# Patient Record
Sex: Female | Born: 1990 | Race: Black or African American | Hispanic: No | Marital: Single | Smoking: Never smoker
Health system: Southern US, Community
[De-identification: ages and names within clinical notes are randomized; demographics above are authoritative.]

## PROBLEM LIST (undated history)

## (undated) DIAGNOSIS — Z789 Other specified health status: Secondary | ICD-10-CM

## (undated) HISTORY — DX: Other specified health status: Z78.9

## (undated) HISTORY — PX: FOOT SURGERY: SHX648

---
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
## 2014-02-17 ENCOUNTER — Emergency Department (HOSPITAL_COMMUNITY)
Admission: EM | Admit: 2014-02-17 | Discharge: 2014-02-17 | Disposition: A | Discharge status: Home / Self Care | Payer: Medicaid Other | Attending: Emergency Medicine | Admitting: Emergency Medicine

## 2014-02-17 ENCOUNTER — Encounter (HOSPITAL_COMMUNITY): Payer: Self-pay | Admitting: Emergency Medicine

## 2014-02-17 DIAGNOSIS — N760 Acute vaginitis: Secondary | ICD-10-CM

## 2014-02-17 DIAGNOSIS — Z3A25 25 weeks gestation of pregnancy: Secondary | ICD-10-CM | POA: Diagnosis not present

## 2014-02-17 DIAGNOSIS — O23592 Infection of other part of genital tract in pregnancy, second trimester: Secondary | ICD-10-CM | POA: Diagnosis not present

## 2014-02-17 DIAGNOSIS — O2342 Unspecified infection of urinary tract in pregnancy, second trimester: Secondary | ICD-10-CM | POA: Diagnosis present

## 2014-02-17 DIAGNOSIS — N390 Urinary tract infection, site not specified: Secondary | ICD-10-CM

## 2014-02-17 DIAGNOSIS — B9689 Other specified bacterial agents as the cause of diseases classified elsewhere: Secondary | ICD-10-CM

## 2014-02-17 DIAGNOSIS — Z3490 Encounter for supervision of normal pregnancy, unspecified, unspecified trimester: Secondary | ICD-10-CM

## 2014-02-17 DIAGNOSIS — Z79899 Other long term (current) drug therapy: Secondary | ICD-10-CM | POA: Diagnosis not present

## 2014-02-17 LAB — URINALYSIS, ROUTINE W REFLEX MICROSCOPIC
Glucose, UA: NEGATIVE mg/dL
Hgb urine dipstick: NEGATIVE
Ketones, ur: NEGATIVE mg/dL
Nitrite: NEGATIVE
PH: 7 (ref 5.0–8.0)
Protein, ur: 30 mg/dL — AB
Specific Gravity, Urine: 1.028 (ref 1.005–1.030)
Urobilinogen, UA: 1 mg/dL (ref 0.0–1.0)

## 2014-02-17 LAB — RPR

## 2014-02-17 LAB — WET PREP, GENITAL
TRICH WET PREP: NONE SEEN
Yeast Wet Prep HPF POC: NONE SEEN

## 2014-02-17 LAB — URINE MICROSCOPIC-ADD ON

## 2014-02-17 LAB — HIV ANTIBODY (ROUTINE TESTING W REFLEX): HIV 1&2 Ab, 4th Generation: NONREACTIVE

## 2014-02-17 MED ORDER — CEPHALEXIN 500 MG PO CAPS: ORAL_CAPSULE | ORAL | Status: DC

## 2014-02-17 MED ORDER — METRONIDAZOLE 500 MG PO TABS: 500.0000 mg | ORAL_TABLET | Freq: Two times a day (BID) | ORAL | Status: DC

## 2014-02-17 NOTE — ED Provider Notes (Signed)
CSN: [id number]     Arrival date & time 02/17/14  1715 History   First MD Initiated Contact with Patient 02/17/14 1728     Chief Complaint  Patient presents with  . [redacted] weeks pregnant, dysuria      (Consider location/radiation/quality/duration/timing/severity/associated sxs/prior Treatment) HPI 23 year old female gravida 3 para 2 approximately 25 weeks estimated gestational age without complications during this pregnancy blood type A+ presents with one week of dysuria with white vaginal discharge with itching with no abdominal pain no vaginal bleeding no vomiting no rash no fever no lightheadedness no chest pain no shortness breath no other concerns. She recently moved to the area and has not seen a new OB doctor here yet although had been having routine OB care in [place] until August this year without complications. There is no treatment prior to arrival. History reviewed. No pertinent past medical history. Past Surgical History  Procedure Laterality Date  . Foot surgery      left foot 2010   History reviewed. No pertinent family history. History  Substance Use Topics  . Smoking status: Never Smoker   . Smokeless tobacco: Not on file  . Alcohol Use: No   OB History   Grav Para Term Preterm Abortions TAB SAB Ect Mult Living   1              Review of Systems  10 Systems reviewed and are negative for acute change except as noted in the HPI.  Allergies  Review of patient's allergies indicates no known allergies.  Home Medications   Prior to Admission medications   Medication Sig Start Date End Date Taking? Authorizing Provider  cephALEXin (KEFLEX) 500 MG capsule 2 caps po bid x 7 days 02/17/14   Hurman Horn, MD  metroNIDAZOLE (FLAGYL) 500 MG tablet Take 1 tablet (500 mg total) by mouth 2 (two) times daily. One po bid x 7 days 02/17/14   Hurman Horn, MD  Prenatal Vit-Fe Fumarate-FA (PRENATAL MULTIVITAMIN) TABS tablet Take 1 tablet by mouth daily at 12 noon.   Yes  Historical Provider, MD   BP 110/61  Pulse 67  Temp(Src) 99.5 F (37.5 C) (Oral)  Resp 16  SpO2 100% Physical Exam  Nursing note and vitals reviewed. Constitutional:  Awake, alert, nontoxic appearance.  HENT:  Head: Atraumatic.  Eyes: Right eye exhibits no discharge. Left eye exhibits no discharge.  Neck: Neck supple.  Cardiovascular: Normal rate and regular rhythm.   No murmur heard. Pulmonary/Chest: Effort normal and breath sounds normal. No respiratory distress. She has no wheezes. She has no rales. She exhibits no tenderness.  Abdominal: Soft. Bowel sounds are normal. She exhibits mass. She exhibits no distension. There is no tenderness. There is no rebound and no guarding.  Nontender gravid uterus with POCUS reveals fetal heart rate 150  Genitourinary:  Chaperone present for pelvic examination no rash noted cervix is closed white discharge present no cervical motion tenderness no adnexal tenderness uterus is nontender and gravid  Musculoskeletal: She exhibits no tenderness.  Baseline ROM, no obvious new focal weakness.  Neurological: She is alert.  Mental status and motor strength appears baseline for patient and situation.  Skin: No rash noted.  Psychiatric: She has a normal mood and affect.    ED Course  Procedures (including critical care time) Labs Review Labs Reviewed  WET PREP, GENITAL - Abnormal; Notable for the following:    Clue Cells Wet Prep HPF POC FEW (*)    WBC, Wet  Prep HPF POC FEW (*)    All other components within normal limits  URINALYSIS, ROUTINE W REFLEX MICROSCOPIC - Abnormal; Notable for the following:    Color, Urine AMBER (*)    APPearance CLOUDY (*)    Bilirubin Urine SMALL (*)    Protein, ur 30 (*)    Leukocytes, UA SMALL (*)    All other components within normal limits  URINE MICROSCOPIC-ADD ON - Abnormal; Notable for the following:    Squamous Epithelial / LPF MANY (*)    All other components within normal limits  GC/CHLAMYDIA PROBE  AMP  URINE CULTURE  RPR  HIV ANTIBODY (ROUTINE TESTING)    Imaging Review No results found.   EKG Interpretation None      MDM   Final diagnoses:  UTI (lower urinary tract infection)  Bacterial vaginosis  Pregnancy    Patient informed of clinical course, understand medical decision-making process, and agree with plan. Questionable UTI, questionable BV.  I doubt any other EMC precluding discharge at this time including, but not necessarily limited to the following:premature labor.    Hurman HornJohn M Raaga Maeder, MD 02/18/14 [phone]

## 2014-02-17 NOTE — ED Notes (Signed)
Pt reports she is [redacted] weeks pregnant, 3rd pregnancy, 2 children, no complications with any pregnancy. [place] here [place], has not seen a doctor here. Last time she saw a doctor was in August. Reports last time sexual intercourse was in August. Pt reports dysuria, vaginal discharge, and itching x1 week. Pain 1/10. Reports feeling baby move in last hour.

## 2014-02-18 LAB — GC/CHLAMYDIA PROBE AMP
CT Probe RNA: NEGATIVE
GC Probe RNA: NEGATIVE

## 2014-02-19 LAB — URINE CULTURE

## 2014-02-25 ENCOUNTER — Encounter: Payer: Self-pay | Admitting: Advanced Practice Midwife

## 2014-02-25 ENCOUNTER — Other Ambulatory Visit: Payer: Self-pay | Admitting: General Practice

## 2014-02-25 DIAGNOSIS — B379 Candidiasis, unspecified: Secondary | ICD-10-CM

## 2014-02-25 DIAGNOSIS — T3695XA Adverse effect of unspecified systemic antibiotic, initial encounter: Principal | ICD-10-CM

## 2014-02-25 MED ORDER — FLUCONAZOLE 150 MG PO TABS: 150.0000 mg | ORAL_TABLET | Freq: Once | ORAL | Status: DC

## 2014-03-06 ENCOUNTER — Encounter: Payer: Self-pay | Admitting: Advanced Practice Midwife

## 2014-03-06 ENCOUNTER — Ambulatory Visit (INDEPENDENT_AMBULATORY_CARE_PROVIDER_SITE_OTHER): Payer: Medicaid Other | Admitting: Advanced Practice Midwife

## 2014-03-06 ENCOUNTER — Encounter: Payer: Self-pay | Admitting: *Deleted

## 2014-03-06 VITALS — BP 118/66 | HR 88 | Temp 98.7°F | Ht 65.0 in | Wt 143.6 lb

## 2014-03-06 DIAGNOSIS — Z3483 Encounter for supervision of other normal pregnancy, third trimester: Secondary | ICD-10-CM

## 2014-03-06 DIAGNOSIS — Z3480 Encounter for supervision of other normal pregnancy, unspecified trimester: Secondary | ICD-10-CM | POA: Insufficient documentation

## 2014-03-06 DIAGNOSIS — Z23 Encounter for immunization: Secondary | ICD-10-CM

## 2014-03-06 LAB — POCT URINALYSIS DIP (DEVICE)
Bilirubin Urine: NEGATIVE
Glucose, UA: NEGATIVE mg/dL
HGB URINE DIPSTICK: NEGATIVE
KETONES UR: NEGATIVE mg/dL
Nitrite: NEGATIVE
PROTEIN: NEGATIVE mg/dL
SPECIFIC GRAVITY, URINE: 1.02 (ref 1.005–1.030)
Urobilinogen, UA: 1 mg/dL (ref 0.0–1.0)
pH: 7 (ref 5.0–8.0)

## 2014-03-06 LAB — CBC
HEMATOCRIT: 25.3 % — AB (ref 36.0–46.0)
HEMOGLOBIN: 8.6 g/dL — AB (ref 12.0–15.0)
MCH: 31.7 pg (ref 26.0–34.0)
MCHC: 34 g/dL (ref 30.0–36.0)
MCV: 93.4 fL (ref 78.0–100.0)
Platelets: 214 10*3/uL (ref 150–400)
RBC: 2.71 MIL/uL — ABNORMAL LOW (ref 3.87–5.11)
RDW: 12.7 % (ref 11.5–15.5)
WBC: 5.2 10*3/uL (ref 4.0–10.5)

## 2014-03-06 MED ORDER — TETANUS-DIPHTH-ACELL PERTUSSIS 5-2.5-18.5 LF-MCG/0.5 IM SUSP: 0.5000 mL | Freq: Once | INTRAMUSCULAR | Status: DC

## 2014-03-06 NOTE — Progress Notes (Signed)
Contacted [facility]-- after 15 minutes of being on hold was forwarded to voicemail system. Left message stating we had faxed over [facility] for records at the beginning of October and have uyet to receive these records. Asked that they please fax records and call to inform us they are on there way.

## 2014-03-06 NOTE — Progress Notes (Signed)
Meredith Simpson contacted [facility] for prenatal records and received faxed stating that there office mailed out her records on 02/28/14.

## 2014-03-06 NOTE — Progress Notes (Signed)
Pain in the groin area New ob packet and 28 week packet given Weight gain of 25-35lbs Tdap/flu consented and info given

## 2014-03-06 NOTE — Progress Notes (Signed)
See new ob note  Subjective:    Meredith Simpson Simpson is a [id number] being seen today for her first obstetrical visit.  Her obstetrical history is significant for NSVD x2. Patient does intend to breast feed. Pregnancy history fully reviewed.  Doing well.  Good fetal movement, denies vaginal bleeding, LOF, regular contractions. Pain in left lower groin area with movement.  Discussed round ligament pain, warm bath, heat, ice, Tylenol, good ergonomics for pain.   Patient reports round ligament pain.  Filed Vitals:   03/06/14 0934 03/06/14 0937  BP: 118/66   Pulse: 88   Temp: 98.7 F (37.1 C)   Height:  5\' 5"  (1.651 m)  Weight: 65.137 kg (143 lb 9.6 oz)     HISTORY: OB History  Gravida Para Term Preterm AB SAB TAB Ectopic Multiple Living  3 2 2       2     # Outcome Date GA Lbr Len/2nd Weight Sex Delivery Anes PTL Lv  3 CUR           2 TRM 03/19/11 3474w0d 00:20 3.175 kg (7 lb) F SVD None  Y  1 TRM 11/24/09 4474w0d 01:00 2.722 kg (6 lb) F SVD None  Y     Past Medical History  Diagnosis Date  . Medical history non-contributory    Past Surgical History  Procedure Laterality Date  . Foot surgery      left foot 2010  . Foot surgery  2010   No family history on file.   Exam    Uterus:  Fundal Height: 29 cm  Pelvic Exam: Deferred-done at previous provider--records requested   Extremities: ROM of all joints is normal   HEENT neck supple with midline trachea and thyroid without masses   Mouth/Teeth mucous membranes moist, pharynx normal without lesions and dental hygiene good   Neck supple and no masses   Cardiovascular:    Respiratory:  appears well, vitals normal, no respiratory distress, acyanotic, normal RR, ear and throat exam is normal, neck free of mass or lymphadenopathy   Abdomen: soft, non-tender; bowel sounds normal; no masses,  no organomegaly   Urinary:       Assessment:    Pregnancy: G3P2002 There are no active problems to display for this patient.        Plan:     Initial labs drawn. Prenatal vitamins. Problem list reviewed and updated. Genetic Screening discussed Quad Screen: May have had these done in [place], records requested.  Marland Kitchen.  Ultrasound discussed; fetal survey: records requested, anatomy scan normal per pt.  Follow up in 2 weeks. 50% of 30 min visit spent on counseling and coordination of care.     Simpson, Meredith 03/06/2014

## 2014-03-07 LAB — GLUCOSE TOLERANCE, 1 HOUR (50G) W/O FASTING: Glucose, 1 Hour GTT: 78 mg/dL (ref 70–140)

## 2014-03-07 LAB — HIV ANTIBODY (ROUTINE TESTING W REFLEX): HIV 1&2 Ab, 4th Generation: NONREACTIVE

## 2014-03-07 LAB — RPR

## 2014-03-10 ENCOUNTER — Encounter: Payer: Self-pay | Admitting: Advanced Practice Midwife

## 2014-03-19 ENCOUNTER — Encounter: Payer: Self-pay | Admitting: Advanced Practice Midwife

## 2014-03-20 ENCOUNTER — Encounter: Payer: Self-pay | Admitting: Advanced Practice Midwife

## 2014-03-20 ENCOUNTER — Ambulatory Visit (INDEPENDENT_AMBULATORY_CARE_PROVIDER_SITE_OTHER): Payer: Medicaid Other | Admitting: Advanced Practice Midwife

## 2014-03-20 VITALS — BP 118/70 | HR 86 | Temp 98.5°F | Wt 151.5 lb

## 2014-03-20 DIAGNOSIS — O99019 Anemia complicating pregnancy, unspecified trimester: Secondary | ICD-10-CM

## 2014-03-20 DIAGNOSIS — R87612 Low grade squamous intraepithelial lesion on cytologic smear of cervix (LGSIL): Secondary | ICD-10-CM | POA: Insufficient documentation

## 2014-03-20 DIAGNOSIS — O99013 Anemia complicating pregnancy, third trimester: Secondary | ICD-10-CM | POA: Insufficient documentation

## 2014-03-20 DIAGNOSIS — Z3483 Encounter for supervision of other normal pregnancy, third trimester: Secondary | ICD-10-CM

## 2014-03-20 LAB — IRON AND TIBC
%SAT: 7 % — ABNORMAL LOW (ref 20–55)
Iron: 33 ug/dL — ABNORMAL LOW (ref 42–145)
TIBC: 462 ug/dL (ref 250–470)
UIBC: 429 ug/dL — ABNORMAL HIGH (ref 125–400)

## 2014-03-20 LAB — FOLATE: FOLATE: 18.2 ng/mL

## 2014-03-20 LAB — VITAMIN B12: Vitamin B-12: 401 pg/mL (ref 211–911)

## 2014-03-20 LAB — FERRITIN: FERRITIN: 9 ng/mL — AB (ref 10–291)

## 2014-03-20 MED ORDER — FERROUS SULFATE 134 MG PO TABS
1.0000 | ORAL_TABLET | Freq: Two times a day (BID) | ORAL | Status: AC
Start: 2014-03-20 — End: ?

## 2014-03-20 MED ORDER — DOCUSATE SODIUM 100 MG PO CAPS: 100.0000 mg | ORAL_CAPSULE | Freq: Two times a day (BID) | ORAL | Status: AC

## 2014-03-20 NOTE — Progress Notes (Signed)
[facility]. EDD by 19 week US 05/30/13. Anatomy scan incomplete, but normal female. LSIL Pap. No HPV testing done. A Pos.

## 2014-03-20 NOTE — Progress Notes (Signed)
Patient reports occasional sharp pelvic pains

## 2014-03-20 NOTE — Patient Instructions (Addendum)
Preterm Labor Information Preterm labor is when labor starts at less than 37 weeks of pregnancy. The normal length of a pregnancy is 39 to 41 weeks. CAUSES Often, there is no identifiable underlying cause as to why a woman goes into preterm labor. One of the most common known causes of preterm labor is infection. Infections of the uterus, cervix, vagina, amniotic sac, bladder, kidney, or even the lungs (pneumonia) can cause labor to start. Other suspected causes of preterm labor include:   Urogenital infections, such as yeast infections and bacterial vaginosis.   Uterine abnormalities (uterine shape, uterine septum, fibroids, or bleeding from the placenta).   A cervix that has been operated on (it may fail to stay closed).   Malformations in the fetus.   Multiple gestations (twins, triplets, and so on).   Breakage of the amniotic sac.  RISK FACTORS  Having a previous history of preterm labor.   Having premature rupture of membranes (PROM).   Having a placenta that covers the opening of the cervix (placenta previa).   Having a placenta that separates from the uterus (placental abruption).   Having a cervix that is too weak to hold the fetus in the uterus (incompetent cervix).   Having too much fluid in the amniotic sac (polyhydramnios).   Taking illegal drugs or smoking while pregnant.   Not gaining enough weight while pregnant.   Being younger than 7918 and older than 24 years old.   Having a low socioeconomic status.   Being African American. SYMPTOMS Signs and symptoms of preterm labor include:   Menstrual-like cramps, abdominal pain, or back pain.  Uterine contractions that are regular, as frequent as six in an hour, regardless of their intensity (may be mild or painful).  Contractions that start on the top of the uterus and spread down to the lower abdomen and back.   A sense of increased pelvic pressure.   A watery or bloody mucus discharge that  comes from the vagina.  TREATMENT Depending on the length of the pregnancy and other circumstances, your health care provider may suggest bed rest. If necessary, there are medicines that can be given to stop contractions and to mature the fetal lungs. If labor happens before 34 weeks of pregnancy, a prolonged hospital stay may be recommended. Treatment depends on the condition of both you and the fetus.  WHAT SHOULD YOU DO IF YOU THINK YOU ARE IN PRETERM LABOR? Call your health care provider right away. You will need to go to the hospital to get checked immediately. HOW CAN YOU PREVENT PRETERM LABOR IN FUTURE PREGNANCIES? You should:   Stop smoking if you smoke.  Maintain healthy weight gain and avoid chemicals and drugs that are not necessary.  Be watchful for any type of infection.  Inform your health care provider if you have a known history of preterm labor. Document Released: 07/24/2003 Document Revised: 01/03/2013 Document Reviewed: 06/05/2012      Iron-Rich Diet An iron-rich diet contains foods that are good sources of iron. Iron is an important mineral that helps your body produce hemoglobin. Hemoglobin is a protein in red blood cells that carries oxygen to the body's tissues. Sometimes, the iron level in your blood can be low. This may be caused by:  A lack of iron in your diet.  Blood loss.  Times of growth, such as during pregnancy or during a child's growth and development. Low levels of iron can cause a decrease in the number of red blood cells.  This can result in iron deficiency anemia. Iron deficiency anemia symptoms include:  Tiredness.  Weakness.  Irritability.  Increased chance of infection. Here are some recommendations for daily iron intake:  Males older than 24 years of age need 8 mg of iron per day.  Women ages 1919 to 8050 need 18 mg of iron per day.  Pregnant women need 27 mg of iron per day, and women who are over 24 years of age and breastfeeding  need 9 mg of iron per day.  Women over the age of 250 need 8 mg of iron per day. SOURCES OF IRON There are 2 types of iron that are found in food: heme iron and nonheme iron. Heme iron is absorbed by the body better than nonheme iron. Heme iron is found in meat, poultry, and fish. Nonheme iron is found in grains, beans, and vegetables. Heme Iron Sources Food / Iron (mg)  Chicken liver, 3 oz (85 g)/ 10 mg  Beef liver, 3 oz (85 g)/ 5.5 mg  Oysters, 3 oz (85 g)/ 8 mg  Beef, 3 oz (85 g)/ 2 to 3 mg  Shrimp, 3 oz (85 g)/ 2.8 mg  [place], 3 oz (85 g)/ 2 mg  Chicken, 3 oz (85 g) / 1 mg  Fish (tuna, halibut), 3 oz (85 g)/ 1 mg  Pork, 3 oz (85 g)/ 0.9 mg Nonheme Iron Sources Food / Iron (mg)  Ready-to-eat breakfast cereal, iron-fortified / 3.9 to 7 mg  Tofu,  cup / 3.4 mg  Kidney beans,  cup / 2.6 mg  Baked potato with skin / 2.7 mg  Asparagus,  cup / 2.2 mg  Avocado / 2 mg  Dried peaches,  cup / 1.6 mg  Raisins,  cup / 1.5 mg  Soy milk, 1 cup / 1.5 mg  Whole-wheat bread, 1 slice / 1.2 mg  Spinach, 1 cup / 0.8 mg  Broccoli,  cup / 0.6 mg IRON ABSORPTION Certain foods can decrease the body's absorption of iron. Try to avoid these foods and beverages while eating meals with iron-containing foods:  Coffee.  Tea.  Fiber.  Soy. Foods containing vitamin C can help increase the amount of iron your body absorbs from iron sources, especially from nonheme sources. Eat foods with vitamin C along with iron-containing foods to increase your iron absorption. Foods that are high in vitamin C include many fruits and vegetables. Some good sources are:  Fresh orange juice.  Oranges.  Strawberries.  Mangoes.  Grapefruit.  Red bell peppers.  Green bell peppers.  Broccoli.  Potatoes with skin.  Tomato juice. Document Released: 12/15/2004 Document Revised: 07/26/2011 Document Reviewed: 10/22/2010 [facility] Patient Information 2015 [employer] [place]. This  information is not intended to replace advice given to you by your health care provider. Make sure you discuss any questions you have with your health care provider.

## 2014-03-20 NOTE — Progress Notes (Signed)
Pt. With no complaints this visit. She reports intermittent lateral wall abdominal pains that she says are better with warm baths. She denies VB, LOF, Discharge, Fever, Chills, Nausea, or Vomiting. She reports good FM, and says that she feels that her pregnancy is going well. She reports longstanding history of anemia that she says has never been worked up before. She says that her children are anemic, but does not know if her parents are anemic or not. She does not know if she has a family history of SS disease or Thalassemia. She denies SOB, weakness, fatigue, or exercise intolerance at this time.

## 2014-03-21 LAB — PRENATAL PROFILE (SOLSTAS)
ANTIBODY SCREEN: NEGATIVE
Basophils Absolute: 0 10*3/uL (ref 0.0–0.1)
Basophils Relative: 0 % (ref 0–1)
Eosinophils Absolute: 0.1 10*3/uL (ref 0.0–0.7)
Eosinophils Relative: 1 % (ref 0–5)
HEMATOCRIT: 23.5 % — AB (ref 36.0–46.0)
HIV 1&2 Ab, 4th Generation: NONREACTIVE
Hemoglobin: 8.2 g/dL — ABNORMAL LOW (ref 12.0–15.0)
Hepatitis B Surface Ag: NEGATIVE
LYMPHS ABS: 1.6 10*3/uL (ref 0.7–4.0)
Lymphocytes Relative: 30 % (ref 12–46)
MCH: 31.7 pg (ref 26.0–34.0)
MCHC: 34.9 g/dL (ref 30.0–36.0)
MCV: 90.7 fL (ref 78.0–100.0)
MONOS PCT: 10 % (ref 3–12)
Monocytes Absolute: 0.5 10*3/uL (ref 0.1–1.0)
Neutro Abs: 3.1 10*3/uL (ref 1.7–7.7)
Neutrophils Relative %: 59 % (ref 43–77)
Platelets: 211 10*3/uL (ref 150–400)
RBC: 2.59 MIL/uL — ABNORMAL LOW (ref 3.87–5.11)
RDW: 13 % (ref 11.5–15.5)
RH TYPE: POSITIVE
Rubella: 3.3 Index — ABNORMAL HIGH (ref <0.90)
WBC: 5.3 10*3/uL (ref 4.0–10.5)

## 2014-03-22 ENCOUNTER — Ambulatory Visit (HOSPITAL_COMMUNITY)
Admission: RE | Admit: 2014-03-22 | Discharge: 2014-03-22 | Disposition: A | Discharge status: Home / Self Care | Payer: Medicaid Other | Source: Ambulatory Visit | Attending: Advanced Practice Midwife | Admitting: Advanced Practice Midwife

## 2014-03-22 DIAGNOSIS — Z3483 Encounter for supervision of other normal pregnancy, third trimester: Secondary | ICD-10-CM

## 2014-03-22 DIAGNOSIS — Z36 Encounter for antenatal screening of mother: Secondary | ICD-10-CM | POA: Diagnosis present

## 2014-03-22 DIAGNOSIS — Z3A30 30 weeks gestation of pregnancy: Secondary | ICD-10-CM | POA: Insufficient documentation

## 2014-03-22 DIAGNOSIS — Z1389 Encounter for screening for other disorder: Secondary | ICD-10-CM | POA: Insufficient documentation

## 2014-03-22 LAB — HEMOGLOBINOPATHY EVALUATION
HEMOGLOBIN OTHER: 0 %
Hgb A2 Quant: 2.4 % (ref 2.2–3.2)
Hgb A: 97.6 % (ref 96.8–97.8)
Hgb F Quant: 0 % (ref 0.0–2.0)
Hgb S Quant: 0 %

## 2014-04-03 ENCOUNTER — Ambulatory Visit (INDEPENDENT_AMBULATORY_CARE_PROVIDER_SITE_OTHER): Payer: Medicaid Other | Admitting: Family

## 2014-04-03 VITALS — BP 121/65 | HR 84 | Temp 98.5°F | Wt 152.7 lb

## 2014-04-03 DIAGNOSIS — Z3483 Encounter for supervision of other normal pregnancy, third trimester: Secondary | ICD-10-CM

## 2014-04-03 LAB — POCT URINALYSIS DIP (DEVICE)
BILIRUBIN URINE: NEGATIVE
Glucose, UA: NEGATIVE mg/dL
Hgb urine dipstick: NEGATIVE
Ketones, ur: NEGATIVE mg/dL
Nitrite: NEGATIVE
PH: 7 (ref 5.0–8.0)
PROTEIN: NEGATIVE mg/dL
SPECIFIC GRAVITY, URINE: 1.015 (ref 1.005–1.030)
Urobilinogen, UA: 1 mg/dL (ref 0.0–1.0)

## 2014-04-03 NOTE — Progress Notes (Signed)
Report white, curdy discharge x 2 days.  No vaginal itching or odor.  Wet prep sent.

## 2014-04-03 NOTE — Progress Notes (Signed)
Pt complains of white, curdy discharge

## 2014-04-04 LAB — WET PREP, GENITAL
Clue Cells Wet Prep HPF POC: NONE SEEN
Trich, Wet Prep: NONE SEEN
WBC, Wet Prep HPF POC: NONE SEEN
Yeast Wet Prep HPF POC: NONE SEEN

## 2014-04-17 ENCOUNTER — Ambulatory Visit (INDEPENDENT_AMBULATORY_CARE_PROVIDER_SITE_OTHER): Payer: Medicaid Other | Admitting: Advanced Practice Midwife

## 2014-04-17 ENCOUNTER — Encounter: Payer: Self-pay | Admitting: *Deleted

## 2014-04-17 ENCOUNTER — Encounter: Payer: Self-pay | Admitting: Advanced Practice Midwife

## 2014-04-17 VITALS — BP 115/70 | HR 80 | Temp 98.3°F | Wt 159.6 lb

## 2014-04-17 DIAGNOSIS — O0930 Supervision of pregnancy with insufficient antenatal care, unspecified trimester: Secondary | ICD-10-CM

## 2014-04-17 DIAGNOSIS — Z3483 Encounter for supervision of other normal pregnancy, third trimester: Secondary | ICD-10-CM

## 2014-04-17 LAB — POCT URINALYSIS DIP (DEVICE)
Bilirubin Urine: NEGATIVE
GLUCOSE, UA: NEGATIVE mg/dL
Hgb urine dipstick: NEGATIVE
KETONES UR: NEGATIVE mg/dL
Nitrite: NEGATIVE
Protein, ur: NEGATIVE mg/dL
SPECIFIC GRAVITY, URINE: 1.02 (ref 1.005–1.030)
UROBILINOGEN UA: 2 mg/dL — AB (ref 0.0–1.0)
pH: 7 (ref 5.0–8.0)

## 2014-04-17 NOTE — Progress Notes (Signed)
Moving to [place]. Discussed transfer process. Will needs records sent. Pt reports [facility] US [facility] w/ EDD 05/29/13. Request records.

## 2014-04-17 NOTE — Patient Instructions (Signed)
[facility]   Braxton Hicks Contractions Contractions of the uterus can occur throughout pregnancy. Contractions are not always a sign that you are in labor.  WHAT ARE BRAXTON HICKS CONTRACTIONS?  Contractions that occur before labor are called Braxton Hicks contractions, or false labor. Toward the end of pregnancy (32-34 weeks), these contractions can develop more often and may become more forceful. This is not true labor because these contractions do not result in opening (dilatation) and thinning of the cervix. They are sometimes difficult to tell apart from true labor because these contractions can be forceful and people have different pain tolerances. You should not feel embarrassed if you go to the hospital with false labor. Sometimes, the only way to tell if you are in true labor is for your health care provider to look for changes in the cervix. If there are no prenatal problems or other health problems associated with the pregnancy, it is completely safe to be sent home with false labor and await the onset of true labor. HOW CAN YOU TELL THE DIFFERENCE BETWEEN TRUE AND FALSE LABOR? False Labor  The contractions of false labor are usually shorter and not as hard as those of true labor.   The contractions are usually irregular.   The contractions are often felt in the front of the lower abdomen and in the groin.   The contractions may go away when you walk around or change positions while lying down.   The contractions get weaker and are shorter lasting as time goes on.   The contractions do not usually become progressively stronger, regular, and closer together as with true labor.  True Labor 1. Contractions in true labor last 30-70 seconds, become very regular, usually become more intense, and increase in frequency.  2. The contractions do not go away with walking.  3. The discomfort is usually felt in the top of the  uterus and spreads to the lower abdomen and low back.  4. True labor can be determined by your health care provider with an exam. This will show that the cervix is dilating and getting thinner.  WHAT TO REMEMBER  Keep up with your usual exercises and follow other instructions given by your health care provider.   Take medicines as directed by your health care provider.   Keep your regular prenatal appointments.   Eat and drink lightly if you think you are going into labor.   If Braxton Hicks contractions are making you uncomfortable:   Change your position from lying down or resting to walking, or from walking to resting.   Sit and rest in a tub of warm water.   Drink 2-3 glasses of water. Dehydration may cause these contractions.   Do slow and deep breathing several times an hour.  WHEN SHOULD I SEEK IMMEDIATE MEDICAL CARE? Seek immediate medical care if:  Your contractions become stronger, more regular, and closer together.   You have fluid leaking or gushing from your vagina.   You have a fever.   You pass blood-tinged mucus.   You have vaginal bleeding.   You have continuous abdominal pain.   You have low back pain that you never had before.   You feel your baby's head pushing down and causing pelvic pressure.   Your baby is not moving as much as it used to.  Document Released: 05/03/2005 Document Revised: 05/08/2013 Document Reviewed: 02/12/2013 [facility] Patient Information 2015 [employer], [place]LL[place]. This information is not intended  to replace advice given to you by your health care provider. Make sure you discuss any questions you have with your health care provider.  Fetal Movement Counts Patient Name: __________________________________________________ Patient Due Date: ____________________ Performing a fetal movement count is highly recommended in high-risk pregnancies, but it is good for every pregnant woman to do. Your health care provider may  ask you to start counting fetal movements at 28 weeks of the pregnancy. Fetal movements often increase:  After eating a full meal.  After physical activity.  After eating or drinking something sweet or cold.  At rest. Pay attention to when you feel the baby is most active. This will help you notice a pattern of your baby's sleep and wake cycles and what factors contribute to an increase in fetal movement. It is important to perform a fetal movement count at the same time each day when your baby is normally most active.  HOW TO COUNT FETAL MOVEMENTS 5. Find a quiet and comfortable area to sit or lie down on your left side. Lying on your left side provides the best blood and oxygen circulation to your baby. 6. Write down the day and time on a sheet of paper or in a journal. 7. Start counting kicks, flutters, swishes, rolls, or jabs in a 2-hour period. You should feel at least 10 movements within 2 hours. 8. If you do not feel 10 movements in 2 hours, wait 2-3 hours and count again. Look for a change in the pattern or not enough counts in 2 hours. SEEK MEDICAL CARE IF:  You feel less than 10 counts in 2 hours, tried twice.  There is no movement in over an hour.  The pattern is changing or taking longer each day to reach 10 counts in 2 hours.  You feel the baby is not moving as he or she usually does. Date: ____________ Movements: ____________ Start time: ____________ Doreatha Martin time: ____________  Date: ____________ Movements: ____________ Start time: ____________ Doreatha Martin time: ____________ Date: ____________ Movements: ____________ Start time: ____________ Doreatha Martin time: ____________ Date: ____________ Movements: ____________ Start time: ____________ Doreatha Martin time: ____________ Date: ____________ Movements: ____________ Start time: ____________ Doreatha Martin time: ____________ Date: ____________ Movements: ____________ Start time: ____________ Doreatha Martin time: ____________ Date: ____________ Movements:  ____________ Start time: ____________ Doreatha Martin time: ____________ Date: ____________ Movements: ____________ Start time: ____________ Doreatha Martin time: ____________  Date: ____________ Movements: ____________ Start time: ____________ Doreatha Martin time: ____________ Date: ____________ Movements: ____________ Start time: ____________ Doreatha Martin time: ____________ Date: ____________ Movements: ____________ Start time: ____________ Doreatha Martin time: ____________ Date: ____________ Movements: ____________ Start time: ____________ Doreatha Martin time: ____________ Date: ____________ Movements: ____________ Start time: ____________ Doreatha Martin time: ____________ Date: ____________ Movements: ____________ Start time: ____________ Doreatha Martin time: ____________ Date: ____________ Movements: ____________ Start time: ____________ Doreatha Martin time: ____________  Date: ____________ Movements: ____________ Start time: ____________ Doreatha Martin time: ____________ Date: ____________ Movements: ____________ Start time: ____________ Doreatha Martin time: ____________ Date: ____________ Movements: ____________ Start time: ____________ Doreatha Martin time: ____________ Date: ____________ Movements: ____________ Start time: ____________ Doreatha Martin time: ____________ Date: ____________ Movements: ____________ Start time: ____________ Doreatha Martin time: ____________ Date: ____________ Movements: ____________ Start time: ____________ Doreatha Martin time: ____________ Date: ____________ Movements: ____________ Start time: ____________ Doreatha Martin time: ____________  Date: ____________ Movements: ____________ Start time: ____________ Doreatha Martin time: ____________ Date: ____________ Movements: ____________ Start time: ____________ Doreatha Martin time: ____________ Date: ____________ Movements: ____________ Start time: ____________ Doreatha Martin time: ____________ Date: ____________ Movements: ____________ Start time: ____________ Doreatha Martin time: ____________ Date: ____________ Movements: ____________ Start time: ____________ Doreatha Martin  time: ____________ Date: ____________ Movements:  ____________ Start time: ____________ Doreatha MartinFinish time: ____________ Date: ____________ Movements: ____________ Start time: ____________ Doreatha MartinFinish time: ____________  Date: ____________ Movements: ____________ Start time: ____________ Doreatha MartinFinish time: ____________ Date: ____________ Movements: ____________ Start time: ____________ Doreatha MartinFinish time: ____________ Date: ____________ Movements: ____________ Start time: ____________ Doreatha MartinFinish time: ____________ Date: ____________ Movements: ____________ Start time: ____________ Doreatha MartinFinish time: ____________ Date: ____________ Movements: ____________ Start time: ____________ Doreatha MartinFinish time: ____________ Date: ____________ Movements: ____________ Start time: ____________ Doreatha MartinFinish time: ____________ Date: ____________ Movements: ____________ Start time: ____________ Doreatha MartinFinish time: ____________  Date: ____________ Movements: ____________ Start time: ____________ Doreatha MartinFinish time: ____________ Date: ____________ Movements: ____________ Start time: ____________ Doreatha MartinFinish time: ____________ Date: ____________ Movements: ____________ Start time: ____________ Doreatha MartinFinish time: ____________ Date: ____________ Movements: ____________ Start time: ____________ Doreatha MartinFinish time: ____________ Date: ____________ Movements: ____________ Start time: ____________ Doreatha MartinFinish time: ____________ Date: ____________ Movements: ____________ Start time: ____________ Doreatha MartinFinish time: ____________ Date: ____________ Movements: ____________ Start time: ____________ Doreatha MartinFinish time: ____________  Date: ____________ Movements: ____________ Start time: ____________ Doreatha MartinFinish time: ____________ Date: ____________ Movements: ____________ Start time: ____________ Doreatha MartinFinish time: ____________ Date: ____________ Movements: ____________ Start time: ____________ Doreatha MartinFinish time: ____________ Date: ____________ Movements: ____________ Start time: ____________ Doreatha MartinFinish time: ____________ Date: ____________  Movements: ____________ Start time: ____________ Doreatha MartinFinish time: ____________ Date: ____________ Movements: ____________ Start time: ____________ Doreatha MartinFinish time: ____________ Date: ____________ Movements: ____________ Start time: ____________ Doreatha MartinFinish time: ____________  Date: ____________ Movements: ____________ Start time: ____________ Doreatha MartinFinish time: ____________ Date: ____________ Movements: ____________ Start time: ____________ Doreatha MartinFinish time: ____________ Date: ____________ Movements: ____________ Start time: ____________ Doreatha MartinFinish time: ____________ Date: ____________ Movements: ____________ Start time: ____________ Doreatha MartinFinish time: ____________ Date: ____________ Movements: ____________ Start time: ____________ Doreatha MartinFinish time: ____________ Date: ____________ Movements: ____________ Start time: ____________ Doreatha MartinFinish time: ____________ Document Released: 06/02/2006 Document Revised: 09/17/2013 Document Reviewed: 02/28/2012 ExitCare Patient Information 2015 [employer], [employer]. This information is not intended to replace advice given to you by your health care provider. Make sure you discuss any questions you have with your health care provider.

## 2014-05-01 ENCOUNTER — Encounter: Payer: Medicaid Other | Admitting: Obstetrics and Gynecology

## 2014-05-23 ENCOUNTER — Inpatient Hospital Stay: Payer: Self-pay | Admitting: Obstetrics and Gynecology

## 2014-05-23 LAB — CBC WITH DIFFERENTIAL/PLATELET
BASOS ABS: 0 10*3/uL (ref 0.0–0.1)
Basophil %: 0.3 %
EOS ABS: 0.1 10*3/uL (ref 0.0–0.7)
Eosinophil %: 1.2 %
HCT: 25.1 % — AB (ref 35.0–47.0)
HGB: 8.4 g/dL — ABNORMAL LOW (ref 12.0–16.0)
LYMPHS PCT: 19.2 %
Lymphocyte #: 1.6 10*3/uL (ref 1.0–3.6)
MCH: 30.7 pg (ref 26.0–34.0)
MCHC: 33.3 g/dL (ref 32.0–36.0)
MCV: 92 fL (ref 80–100)
Monocyte #: 0.8 x10 3/mm (ref 0.2–0.9)
Monocyte %: 9.6 %
NEUTROS ABS: 5.7 10*3/uL (ref 1.4–6.5)
Neutrophil %: 69.7 %
PLATELETS: 201 10*3/uL (ref 150–440)
RBC: 2.73 10*6/uL — AB (ref 3.80–5.20)
RDW: 13.1 % (ref 11.5–14.5)
WBC: 8.2 10*3/uL (ref 3.6–11.0)

## 2014-05-23 LAB — GC/CHLAMYDIA PROBE AMP

## 2014-05-24 ENCOUNTER — Encounter (HOSPITAL_COMMUNITY): Payer: Self-pay

## 2014-05-24 LAB — HEMATOCRIT: HCT: 24.5 % — AB (ref 35.0–47.0)

## 2014-09-24 NOTE — H&P (Signed)
L&D Evaluation:  History:  HPI 24 y/o G3P2002 @ 39wks EDC 05/30/14. Here for augment of labor advanced cervical dilation. Care @ [facility]. HX 2 precipitous deliveries 1hr 1st and 20mins 2nd. Anemia HGB 8.4 GBS negative. HX Abn pap LGSIL.   Presents with above   Patient's Medical History No Chronic Illness   Patient's Surgical History none   Medications Pre Natal Vitamins   Allergies NKDA   Social History none   Family History Non-Contributory   ROS:  ROS All systems were reviewed.  HEENT, CNS, GI, GU, Respiratory, CV, Renal and Musculoskeletal systems were found to be normal.   Exam:  Vital Signs stable   Urine Protein not completed   General no apparent distress   Mental Status clear   Chest clear   Heart normal sinus rhythm   Abdomen gravid, non-tender   Estimated Fetal Weight Average for gestational age   Fetal Position vtx   Fundal Height term   Back no CVAT   Edema no edema   Reflexes 2+   Clonus negative   Pelvic no external lesions, 6-7cm 90% vtx @ -1   Mebranes Ruptured, AROM @ 1626   Description clear   FHT normal rate with no decels, avg variability accels 140-160's   FHT Description 150   Ucx irregular, mild   Skin dry   Lymph no lymphadenopathy   Impression:  Impression early labor   Plan:  Plan monitor contractions and for cervical change   Comments Admitted, knows what to expect 3rd baby. Plans natural childbirth. Pitocin augment begun, AROM clear fluid.   Electronic Signatures: Albertina ParrLugiano, Lorri Fukuhara B (CNM)  (Signed 07-Jan-16 16:38)  Authored: L&D Evaluation   Last Updated: 07-Jan-16 16:38 by Albertina ParrLugiano, Tj Kitchings B (CNM)

## 2014-11-22 ENCOUNTER — Encounter: Payer: Self-pay | Admitting: Emergency Medicine

## 2014-11-22 ENCOUNTER — Emergency Department
Admission: EM | Admit: 2014-11-22 | Discharge: 2014-11-22 | Disposition: A | Discharge status: Home / Self Care | Payer: BLUE CROSS/BLUE SHIELD | Attending: Emergency Medicine | Admitting: Emergency Medicine

## 2014-11-22 DIAGNOSIS — Z79899 Other long term (current) drug therapy: Secondary | ICD-10-CM | POA: Insufficient documentation

## 2014-11-22 DIAGNOSIS — K088 Other specified disorders of teeth and supporting structures: Secondary | ICD-10-CM | POA: Diagnosis present

## 2014-11-22 DIAGNOSIS — K029 Dental caries, unspecified: Secondary | ICD-10-CM | POA: Insufficient documentation

## 2014-11-22 MED ORDER — PENICILLIN V POTASSIUM 500 MG PO TABS: 500.0000 mg | ORAL_TABLET | Freq: Four times a day (QID) | ORAL | Status: AC

## 2014-11-22 NOTE — ED Notes (Signed)
Right upper tooth pain X 1 week. Now causing ear and throat pain on related side

## 2014-11-22 NOTE — ED Provider Notes (Signed)
[facility] Emergency Department Provider Note  ____________________________________________  Time seen: 41  I have reviewed the triage vital signs and the nursing notes.   HISTORY  Chief Complaint Dental Pain   HPI Meredith Simpson is a 24 y.o. female is here with complaint of right upper tooth pain 1 week. She states that now her right ear hurts. She states she is taken over-the-counter medication without any improvement. She has not seen a dentist at this time nor does she have a dentist. Currently she states her pain is 10/10.     Past Medical History  Diagnosis Date  . Medical history non-contributory     Patient Active Problem List   Diagnosis Date Noted  . Encounter for routine screening for malformation using ultrasonics   . [redacted] weeks gestation of pregnancy   . Anemia during pregnancy in third trimester 03/20/2014  . Low grade squamous intraepithelial lesion (LGSIL) on Papanicolaou smear of cervix 03/20/2014  . Supervision of normal subsequent pregnancy 03/06/2014    Past Surgical History  Procedure Laterality Date  . Foot surgery      left foot 2010  . Foot surgery  2010    Current Outpatient Rx  Name  Route  Sig  Dispense  Refill  . docusate sodium (COLACE) 100 MG capsule   Oral   Take 1 capsule (100 mg total) by mouth 2 (two) times daily.   10 capsule   0   . Ferrous Sulfate 134 MG TABS   Oral   Take 1 tablet (134 mg total) by mouth 2 (two) times daily.   60 each   2   . penicillin v potassium (VEETID) 500 MG tablet   Oral   Take 1 tablet (500 mg total) by mouth 4 (four) times daily.   28 tablet   0   . prenatal vitamin w/FE, FA (PRENATAL 1 + 1) 27-1 MG TABS tablet   Oral   Take 1 tablet by mouth daily at 12 noon.           Allergies Review of patient's allergies indicates no known allergies.  History reviewed. No pertinent family history.  Social History History  Substance Use Topics  . Smoking status: Never  Smoker   . Smokeless tobacco: Never Used  . Alcohol Use: No    Review of Systems Constitutional: No fever/chills Eyes: No visual changes. ENT: No sore throat. Cardiovascular: Denies chest pain. Respiratory: Denies shortness of breath. Gastrointestinal: No abdominal pain.  No nausea, no vomiting. Genitourinary: Negative for dysuria. Musculoskeletal: Negative for back pain. Skin: Negative for rash. Neurological: Negative for headaches  10-point ROS otherwise negative.  ____________________________________________   PHYSICAL EXAM:  VITAL SIGNS: ED Triage Vitals  Enc Vitals Group     BP 11/22/14 1344 149/98 mmHg     Pulse Rate 11/22/14 1344 74     Resp 11/22/14 1344 18     Temp 11/22/14 1344 98.5 F (36.9 C)     Temp Source 11/22/14 1344 Oral     SpO2 11/22/14 1344 98 %     Weight 11/22/14 1344 155 lb (70.308 kg)     Height 11/22/14 1344  (1.651 m)     Head Cir --      Peak Flow --      Pain Score 11/22/14 1345 10     Pain Loc --      Pain Edu? --      Excl. in GC? --  Constitutional: Alert and oriented. Well appearing and in no acute distress. Eyes: Conjunctivae are normal. PERRL. EOMI. Head: Atraumatic. Nose: No congestion/rhinnorhea. Mouth/Throat: Mucous membranes are moist.  Oropharynx non-erythematous. Right upper molars without any severe caries. Patient has multiple fillings. There is some tenderness on palpation of the gum area but no localized abscess was seen. Neck: No stridor.  Supple Hematological/Lymphatic/Immunilogical: No cervical lymphadenopathy. Cardiovascular: Normal rate, regular rhythm. Grossly normal heart sounds.  Good peripheral circulation. Respiratory: Normal respiratory effort.  No retractions. Lungs CTAB. Gastrointestinal: Soft and nontender. No distention.  Musculoskeletal: No lower extremity tenderness nor edema.  No joint effusions. Neurologic:  Normal speech and language. No gross focal neurologic deficits are appreciated.  Speech is normal. No gait instability. Skin:  Skin is warm, dry and intact. No rash noted. Psychiatric: Mood and affect are normal. Speech and behavior are normal.  ____________________________________________   LABS (all labs ordered are listed, but only abnormal results are displayed)  Labs Reviewed - No data to display _ PROCEDURES  Procedure(s) performed: None  Critical Care performed: No  ____________________________________________   INITIAL IMPRESSION / ASSESSMENT AND PLAN / ED COURSE  Pertinent labs & imaging results that were available during my care of the patient were reviewed by me and considered in my medical decision making (see chart for details).  Patient was given a prescription for Pen-Vee K. Because she has insurance she does not qualify for any of the free clinics in the area. She was encouraged to get a dentist as soon as possible. ____________________________________________   FINAL CLINICAL IMPRESSION(S) / ED DIAGNOSES  Final diagnoses:  Pain due to dental caries      Tommi RumpsRhonda L Summers, PA-C 11/22/14 1541  Minna AntisKevin Paduchowski, MD 11/23/14 [phone]

## 2014-11-22 NOTE — Discharge Instructions (Signed)
Dental Pain Toothache is pain in or around a tooth. It may get worse with chewing or with cold or heat.  HOME CARE  Your dentist may use a numbing medicine during treatment. If so, you may need to avoid eating until the medicine wears off. Ask your dentist about this.  Only take medicine as told by your dentist or doctor.  Avoid chewing food near the painful tooth until after all treatment is done. Ask your dentist about this. GET HELP RIGHT AWAY IF:   The problem gets worse or new problems appear.  You have a fever.  There is redness and puffiness (swelling) of the face, jaw, or neck.  You cannot open your mouth.  There is pain in the jaw.  There is very bad pain that is not helped by medicine. MAKE SURE YOU:   Understand these instructions.  Will watch your condition.  Will get help right away if you are not doing well or get worse. Document Released: 10/20/2007 Document Revised: 07/26/2011 Document Reviewed: 10/20/2007 [facility] CenterExit[facility] Patient Information 2015 [place], [place]. This information is not intended to replace advice given to you by your health care provider. Make sure you discuss any questions you have with your health care provider.    YOU WILL NEED TO FIND A DENTIST IN THE PHONE BOOK SINCE THERE ARE NO DENTAL LIST

## 2015-08-07 IMAGING — US US OB COMP +14 WK
1 series · 12 of 28 positions shown · non-contrast
Comparison: none

[Series 1: us ob comp +14 wk · 0.21mm/px · 100 acquisitions, 12 frames shown]
[im 4/100]
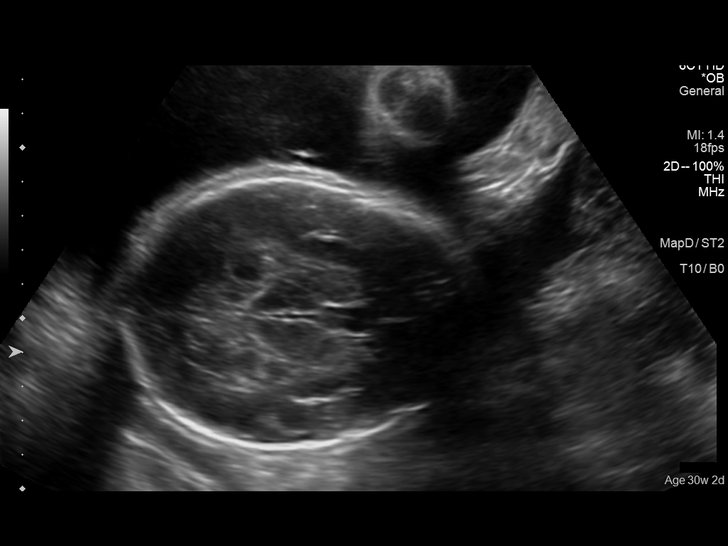
[im 12/100]
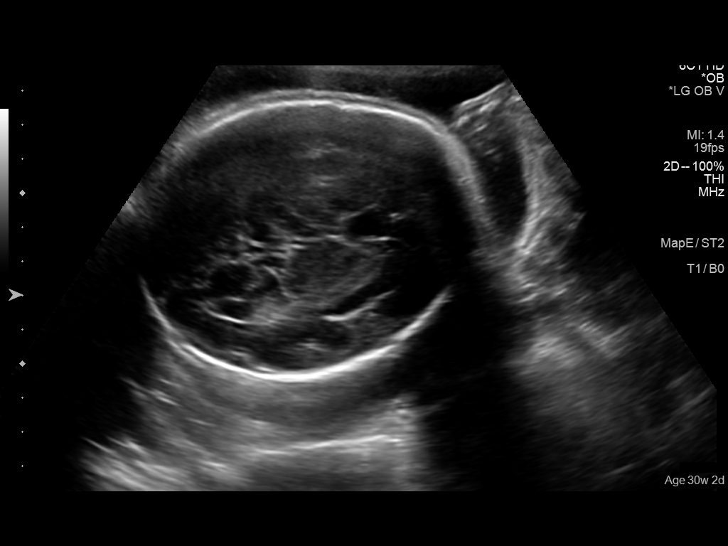
[im 19/100]
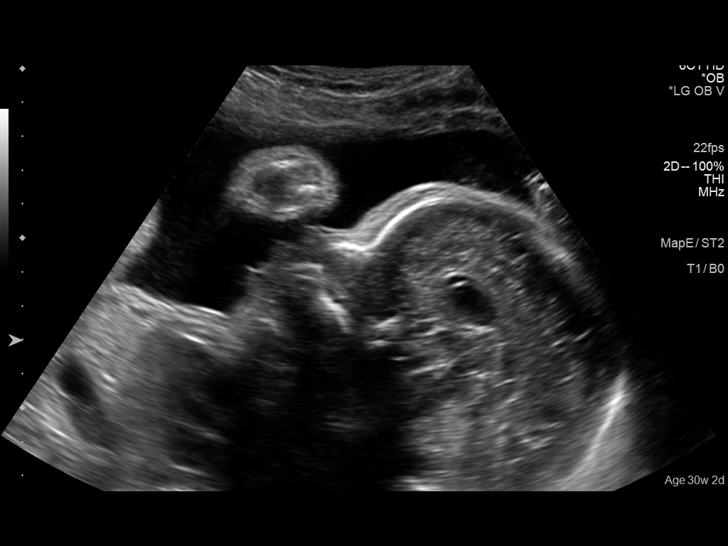
[im 30/100]
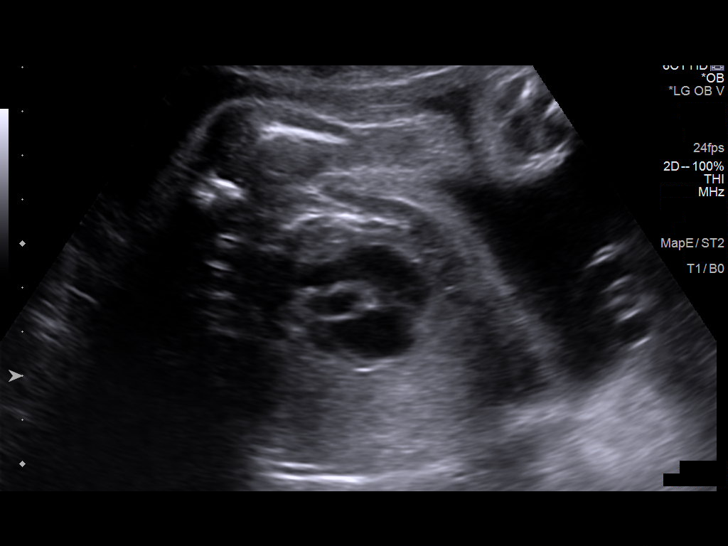
[im 37/100]
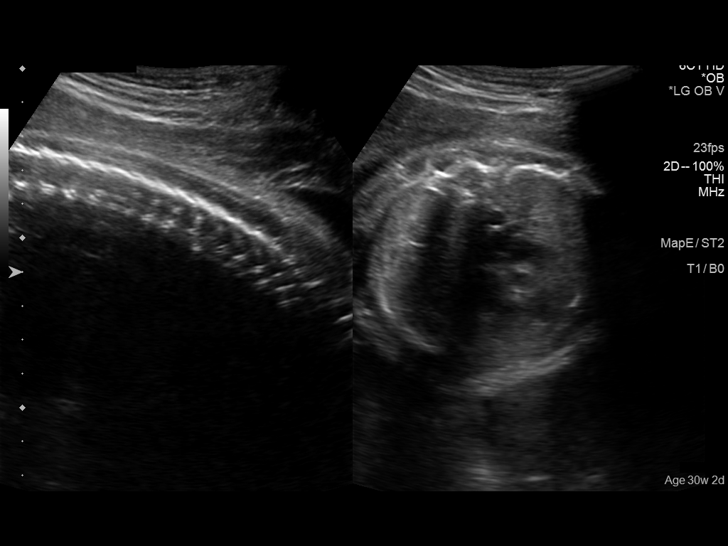
[im 45/100]
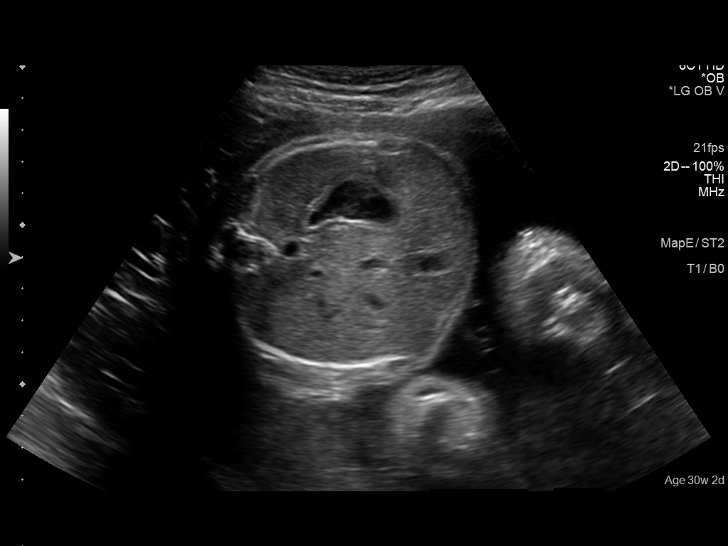
[im 56/100]
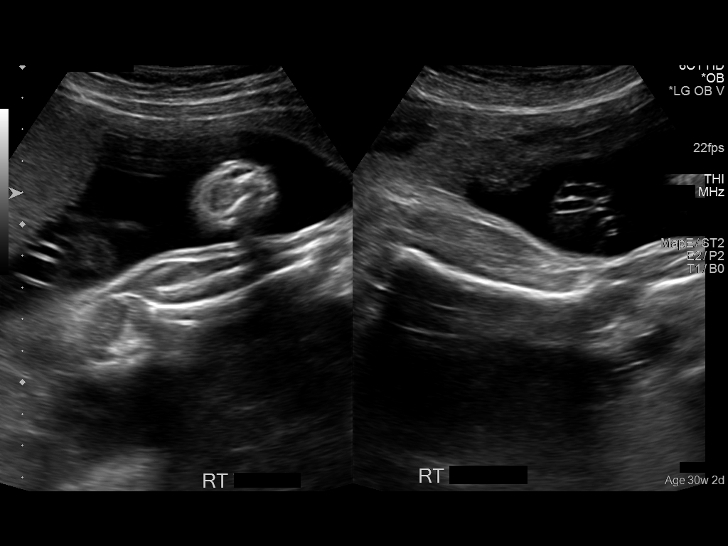
[im 63/100]
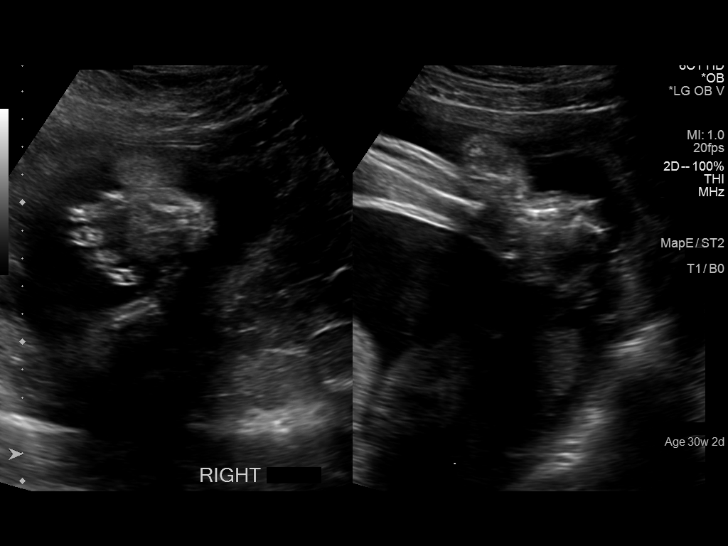
[im 70/100]
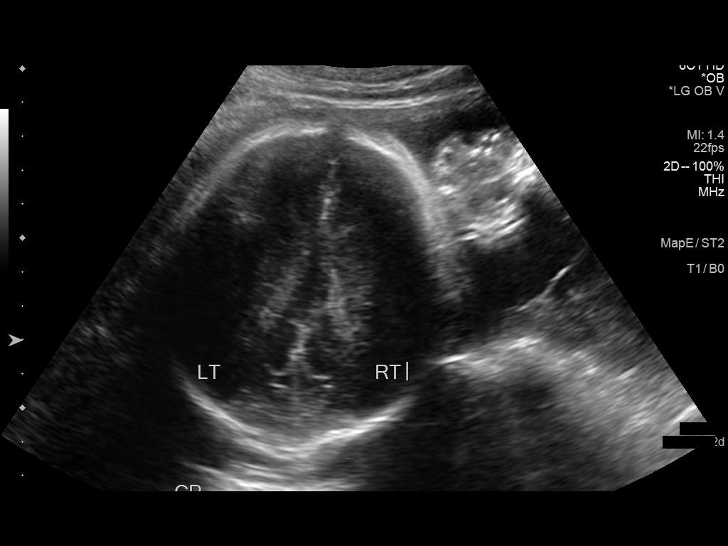
[im 81/100]
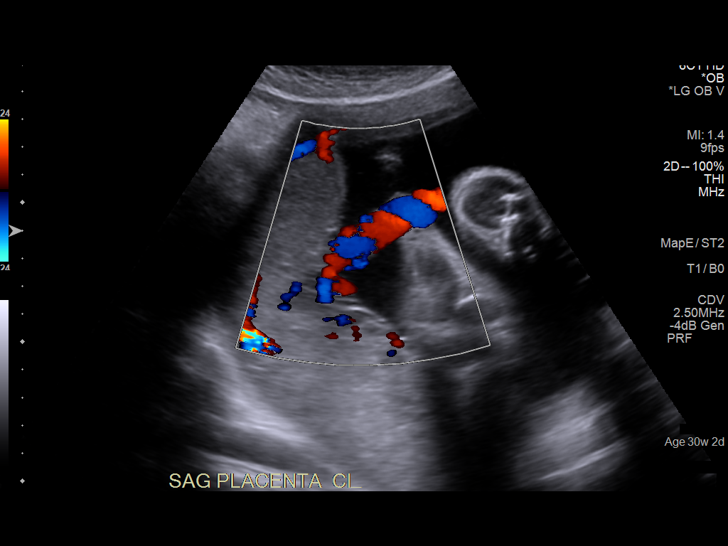
[im 89/100]
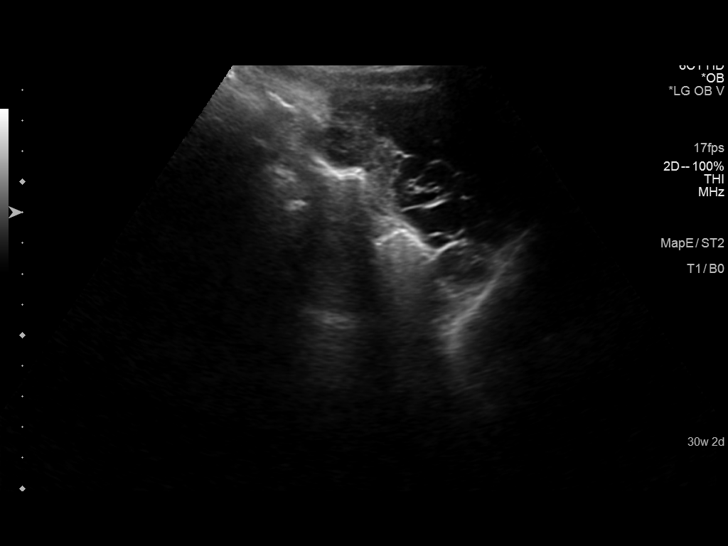
[im 96/100]
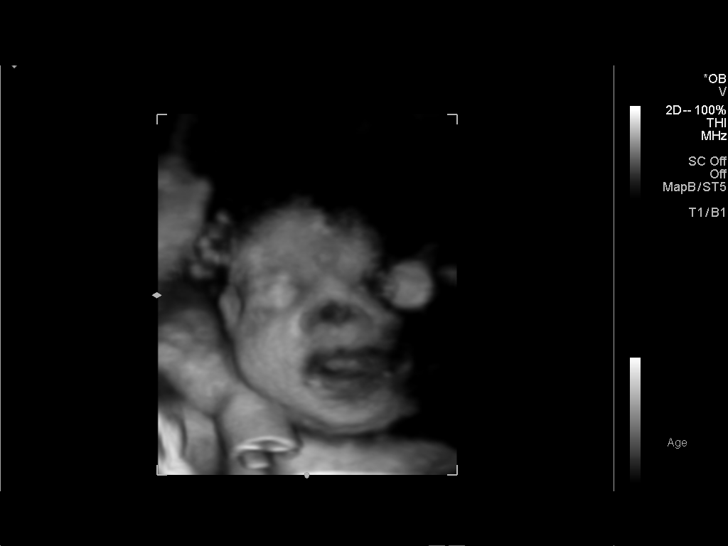

[12 of 28 positions shown; findings below may reference images not displayed]

OBSTETRICS REPORT
                      (Signed Final 03/22/2014 [DATE])

Service(s) Provided

 US OB COMP + 14 WK                                    76805.1
Indications

 30 weeks gestation of pregnancy
 Basic anatomic survey                                 z36
Fetal Evaluation

 Num Of Fetuses:    1
 Fetal Heart Rate:  143                          bpm
 Cardiac Activity:  Observed
 Presentation:      Cephalic
 Placenta:          Posterior, above cervical
                    os
 P. Cord            Visualized, central
 Insertion:

 Amniotic Fluid
 AFI FV:      Subjectively upper-normal
 AFI Sum:     22.19   cm       89  %Tile     Larg Pckt:    6.59  cm
 RUQ:   4.98    cm   RLQ:    5.31   cm    LUQ:   6.59    cm   LLQ:    5.31   cm
Biometry

 BPD:     78.4  mm     G. Age:  31w 3d                CI:        75.88   70 - 86
                                                      FL/HC:      21.1   19.2 -

 HC:     285.3  mm     G. Age:  31w 2d       49  %    HC/AC:      1.09   0.99 -

 AC:     260.9  mm     G. Age:  30w 1d       48  %    FL/BPD:     76.7   71 - 87
 FL:      60.1  mm     G. Age:  31w 2d       67  %    FL/AC:      23.0   20 - 24
 HUM:     52.9  mm     G. Age:  30w 6d       65  %
 CER:     36.6  mm     G. Age:  31w 4d       66  %
 Est. FW:    2626  gm    3 lb 10 oz      65  %
Gestational Age

 Clinical EDD:  30w 1d                                        EDD:   05/30/14
 U/S Today:     31w 0d                                        EDD:   05/24/14
 Best:          30w 1d     Det. By:  Clinical EDD             EDD:   05/30/14
Anatomy
 Cranium:          Appears normal         Aortic Arch:      Appears normal
 Fetal Cavum:      Appears normal         Ductal Arch:      Appears normal
 Ventricles:       Appears normal         Diaphragm:        Appears normal
 Choroid Plexus:   Appears normal         Stomach:          Appears normal, left
                                                            sided
 Cerebellum:       Appears normal         Abdomen:          Appears normal
 Posterior Fossa:  Appears normal         Abdominal Wall:   Appears nml (cord
                                                            insert, abd wall)
 Nuchal Fold:      Not applicable (>20    Cord Vessels:     Appears normal (3
                   wks GA)                                  vessel cord)
 Face:             Appears normal         Kidneys:          Appear normal
                   (orbits and profile)
 Lips:             Appears normal         Bladder:          Appears normal
 Heart:            Appears normal         Spine:            Appears normal
                   (4CH, axis, and
                   situs)
 RVOT:             Appears normal         Lower             Appears normal
                                          Extremities:
 LVOT:             Appears normal         Upper             Appears normal
                                          Extremities:

 Other:  Fetus appears to be a female. Technically difficult due to advanced
         gestational age.
Targeted Anatomy

 Fetal Central Nervous System
 Lat. Ventricles:  5.7                    Cisterna Magna:
Cervix Uterus Adnexa

 Cervix:       Not visualized (advanced GA >85wks)
 Uterus:       No abnormality visualized.
 Cul De Sac:   No free fluid seen.

 Left Ovary:    No adnexal mass visualized.
 Right Ovary:   No adnexal mass visualized.
 Adnexa:     No adnexal mass visualized.
Impression

 Single IUP at 30w 1d
 Normal fetal anatomic survey
 Fetal growth is appropriate (65th %tile)
 Posterior placenta
 Normal amniotic fluid volume
Recommendations

 Consider follow up ultrasound in 4 weeks for growth given
 late onset of care.

 questions or concerns.

## 2015-10-16 ENCOUNTER — Emergency Department
Admission: EM | Admit: 2015-10-16 | Discharge: 2015-10-16 | Disposition: A | Discharge status: Home / Self Care | Payer: BLUE CROSS/BLUE SHIELD | Attending: Emergency Medicine | Admitting: Emergency Medicine

## 2015-10-16 ENCOUNTER — Encounter: Payer: Self-pay | Admitting: Emergency Medicine

## 2015-10-16 ENCOUNTER — Emergency Department: Payer: BLUE CROSS/BLUE SHIELD

## 2015-10-16 DIAGNOSIS — Z792 Long term (current) use of antibiotics: Secondary | ICD-10-CM | POA: Insufficient documentation

## 2015-10-16 DIAGNOSIS — Z79899 Other long term (current) drug therapy: Secondary | ICD-10-CM | POA: Insufficient documentation

## 2015-10-16 DIAGNOSIS — R079 Chest pain, unspecified: Secondary | ICD-10-CM

## 2015-10-16 DIAGNOSIS — R0789 Other chest pain: Secondary | ICD-10-CM | POA: Insufficient documentation

## 2015-10-16 LAB — CBC
HCT: 34.5 % — ABNORMAL LOW (ref 35.0–47.0)
Hemoglobin: 12 g/dL (ref 12.0–16.0)
MCH: 31.5 pg (ref 26.0–34.0)
MCHC: 34.9 g/dL (ref 32.0–36.0)
MCV: 90.2 fL (ref 80.0–100.0)
Platelets: 290 10*3/uL (ref 150–440)
RBC: 3.82 MIL/uL (ref 3.80–5.20)
RDW: 12.4 % (ref 11.5–14.5)
WBC: 6.8 10*3/uL (ref 3.6–11.0)

## 2015-10-16 LAB — TROPONIN I: Troponin I: <0.03 ng/mL (ref <0.031)

## 2015-10-16 LAB — BASIC METABOLIC PANEL
Anion gap: 8 (ref 5–15)
BUN: 11 mg/dL (ref 6–20)
CALCIUM: 9.5 mg/dL (ref 8.9–10.3)
CO2: 25 mmol/L (ref 22–32)
CREATININE: 0.73 mg/dL (ref 0.44–1.00)
Chloride: 106 mmol/L (ref 101–111)
GFR calc Af Amer: >60 mL/min (ref >60)
GFR calc non Af Amer: >60 mL/min (ref >60)
Glucose, Bld: 87 mg/dL (ref 65–99)
Potassium: 3.5 mmol/L (ref 3.5–5.1)
Sodium: 139 mmol/L (ref 135–145)

## 2015-10-16 LAB — POCT PREGNANCY, URINE: PREG TEST UR: NEGATIVE

## 2015-10-16 NOTE — ED Notes (Signed)
Patient transported to X-ray 

## 2015-10-16 NOTE — ED Notes (Signed)
Pt alert and oriented X4, active, cooperative, pt in NAD. RR even and unlabored, color WNL.  Pt informed to return if any life threatening symptoms occur.  Pt ambulatory. 

## 2015-10-16 NOTE — ED Provider Notes (Signed)
[facility] Emergency Department Provider Note  ____________________________________________    I have reviewed the triage vital signs and the nursing notes.   HISTORY  Chief Complaint Chest Pain    HPI Meredith Simpson is a 25 y.o. female who presents with complaints of chest discomfort. Patient reports she has had chest pressure radiating to her left arm since Saturday. She reports this is been mostly constant but seemed to be worse today. 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 She feels stress is likely the cause of her chest discomfort. No history of chest pain the past. She does not smoke. No drugs. She denies pleurisy. No calf pain or swelling. No shortness of breath     Past Medical History  Diagnosis Date  . Medical history non-contributory     Patient Active Problem List   Diagnosis Date Noted  . Encounter for routine screening for malformation using ultrasonics   . [redacted] weeks gestation of pregnancy   . Anemia during pregnancy in third trimester 03/20/2014  . Low grade squamous intraepithelial lesion (LGSIL) on Papanicolaou smear of cervix 03/20/2014  . Supervision of normal subsequent pregnancy 03/06/2014    Past Surgical History  Procedure Laterality Date  . Foot surgery      left foot 2010  . Foot surgery  2010    Current Outpatient Rx  Name  Route  Sig  Dispense  Refill  . docusate sodium (COLACE) 100 MG capsule   Oral   Take 1 capsule (100 mg total) by mouth 2 (two) times daily.   10 capsule   0   . Ferrous Sulfate 134 MG TABS   Oral   Take 1 tablet (134 mg total) by mouth 2 (two) times daily.   60 each   2   . penicillin v potassium (VEETID) 500 MG tablet   Oral   Take 1 tablet (500 mg total) by mouth 4 (four) times daily.   28 tablet   0   . prenatal vitamin w/FE, FA (PRENATAL 1 + 1) 27-1 MG TABS tablet   Oral   Take 1 tablet by mouth daily at 12 noon.            Allergies Review of patient's allergies indicates no known allergies.  No family history on file.  Social History Social History  Substance Use Topics  . Smoking status: Never Smoker   . Smokeless tobacco: Never Used  . Alcohol Use: No    Review of Systems  Constitutional: Negative for fever. Eyes: Negative for redness ENT: Negative for sore throat Cardiovascular: As above Respiratory: Negative for shortness of breath. Gastrointestinal: Negative for abdominal pain Genitourinary: Negative for dysuria. Musculoskeletal: Negative for back pain. Skin: Negative for rash. No diaphoresis Neurological: Negative for focal weakness Psychiatric: no anxiety    ____________________________________________   PHYSICAL EXAM:  VITAL SIGNS: ED Triage Vitals  Enc Vitals Group     BP 10/16/15 0935 146/80 mmHg     Pulse Rate 10/16/15 0935 87     Resp 10/16/15 0935 20     Temp 10/16/15 0935 98.4 F (36.9 C)     Temp Source 10/16/15 0935 Oral     SpO2 10/16/15 0935 100 %     Weight --      Height --      Head Cir --      Peak Flow --  Pain Score 10/16/15 0932 8     Pain Loc --      Pain Edu? --      Excl. in GC? --      Constitutional: Alert and oriented. Well appearing and in no distress.  Eyes: Conjunctivae are normal. No erythema or injection ENT   Head: Normocephalic and atraumatic.   Mouth/Throat: Mucous membranes are moist. Cardiovascular: Normal rate, regular rhythm. Normal and symmetric distal pulses are present in the upper extremities. No murmurs or rubs  Respiratory: Normal respiratory effort without tachypnea nor retractions. Breath sounds are clear and equal bilaterally.  Gastrointestinal: Soft and non-tender in all quadrants. No distention. There is no CVA tenderness. Genitourinary: deferred Musculoskeletal: Nontender with normal range of motion in all extremities. No lower extremity tenderness nor edema. Neurologic:  Normal speech and  language. No gross focal neurologic deficits are appreciated. Skin:  Skin is warm, dry and intact. No rash noted. Psychiatric: Mood and affect are normal. Patient exhibits appropriate insight and judgment.  ____________________________________________    LABS (pertinent positives/negatives)  Labs Reviewed  CBC - Abnormal; Notable for the following:    HCT 34.5 (*)    All other components within normal limits  BASIC METABOLIC PANEL  TROPONIN I  POCT PREGNANCY, URINE    ____________________________________________   EKG  ED ECG REPORT I, Jene Every, the attending physician, personally viewed and interpreted this ECG.  Date: 10/16/2015 EKG Time: 9:36 AM Rate: 79 Rhythm: normal sinus rhythm QRS Axis: normal Intervals: normal ST/T Wave abnormalities: normal Conduction Disturbances: none Narrative Interpretation: unremarkable   ____________________________________________    RADIOLOGY  Normal chest x-ray  ____________________________________________   PROCEDURES  Procedure(s) performed: none  Critical Care performed: none  ____________________________________________   INITIAL IMPRESSION / ASSESSMENT AND PLAN / ED COURSE  Pertinent labs & imaging results that were available during my care of the patient were reviewed by me and considered in my medical decision making (see chart for details).  Patient well-appearing and in no distress. Given her age and lack of risk factors, ACS is highly unlikely. We will check labs, EKG chest x-ray and reevaluate.  ----------------------------------------- 11:01 AM on 10/16/2015 -----------------------------------------  Lab work is unremarkable, EKG is normal, chest x-ray is benign. Patient to be well appearing.History of present illness not consistent with ACS, myocarditis, pericarditis, PE, dissection. She is reassured by test results. I'll have her follow-up with her PCP for further  workup.  ____________________________________________   FINAL CLINICAL IMPRESSION(S) / ED DIAGNOSES  Final diagnoses:  Chest pain, unspecified chest pain type          Jene Every, MD 10/16/15 [phone]

## 2015-10-16 NOTE — ED Notes (Addendum)
Pt presents with chest pressure radiating down into left arm since Saturday. Denies any other symptoms. Pt states she works at [employer] and is very stressful at times.

## 2015-10-16 NOTE — Discharge Instructions (Signed)
Nonspecific Chest Pain  °Chest pain can be caused by many different conditions. There is always a chance that your pain could be related to something serious, such as a heart attack or a blood clot in your lungs. Chest pain can also be caused by conditions that are not life-threatening. If you have chest pain, it is very important to follow up with your health care provider. °CAUSES  °Chest pain can be caused by: °· Heartburn. °· Pneumonia or bronchitis. °· Anxiety or stress. °· Inflammation around your heart (pericarditis) or lung (pleuritis or pleurisy). °· A blood clot in your lung. °· A collapsed lung (pneumothorax). It can develop suddenly on its own (spontaneous pneumothorax) or from trauma to the chest. °· Shingles infection (varicella-zoster virus). °· Heart attack. °· Damage to the bones, muscles, and cartilage that make up your chest wall. This can include: °¨ Bruised bones due to injury. °¨ Strained muscles or cartilage due to frequent or repeated coughing or overwork. °¨ Fracture to one or more ribs. °¨ Sore cartilage due to inflammation (costochondritis). °RISK FACTORS  °Risk factors for chest pain may include: °· Activities that increase your risk for trauma or injury to your chest. °· Respiratory infections or conditions that cause frequent coughing. °· Medical conditions or overeating that can cause heartburn. °· Heart disease or family history of heart disease. °· Conditions or health behaviors that increase your risk of developing a blood clot. °· Having had chicken pox (varicella zoster). °SIGNS AND SYMPTOMS °Chest pain can feel like: °· Burning or tingling on the surface of your chest or deep in your chest. °· Crushing, pressure, aching, or squeezing pain. °· Dull or sharp pain that is worse when you move, cough, or take a deep breath. °· Pain that is also felt in your back, neck, shoulder, or arm, or pain that spreads to any of these areas. °Your chest pain may come and go, or it may stay  constant. °DIAGNOSIS °Lab tests or other studies may be needed to find the cause of your pain. Your health care provider may have you take a test called an ambulatory ECG (electrocardiogram). An ECG records your heartbeat patterns at the time the test is performed. You may also have other tests, such as: °· Transthoracic echocardiogram (TTE). During echocardiography, sound waves are used to create a picture of all of the heart structures and to look at how blood flows through your heart. °· Transesophageal echocardiogram (TEE). This is a more advanced imaging test that obtains images from inside your body. It allows your health care provider to see your heart in finer detail. °· Cardiac monitoring. This allows your health care provider to monitor your heart rate and rhythm in real time. °· Holter monitor. This is a portable device that records your heartbeat and can help to diagnose abnormal heartbeats. It allows your health care provider to track your heart activity for several days, if needed. °· Stress tests. These can be done through exercise or by taking medicine that makes your heart beat more quickly. °· Blood tests. °· Imaging tests. °TREATMENT  °Your treatment depends on what is causing your chest pain. Treatment may include: °· Medicines. These may include: °¨ Acid blockers for heartburn. °¨ Anti-inflammatory medicine. °¨ Pain medicine for inflammatory conditions. °¨ Antibiotic medicine, if an infection is present. °¨ Medicines to dissolve blood clots. °¨ Medicines to treat coronary artery disease. °· Supportive care for conditions that do not require medicines. This may include: °¨ Resting. °¨ Applying heat   or cold packs to injured areas. °¨ Limiting activities until pain decreases. °HOME CARE INSTRUCTIONS °· If you were prescribed an antibiotic medicine, finish it all even if you start to feel better. °· Avoid any activities that bring on chest pain. °· Do not use any tobacco products, including  cigarettes, chewing tobacco, or electronic cigarettes. If you need help quitting, ask your health care provider. °· Do not drink alcohol. °· Take medicines only as directed by your health care provider. °· Keep all follow-up visits as directed by your health care provider. This is important. This includes any further testing if your chest pain does not go away. °· If heartburn is the cause for your chest pain, you may be told to keep your head raised (elevated) while sleeping. This reduces the chance that acid will go from your stomach into your esophagus. °· Make lifestyle changes as directed by your health care provider. These may include: °¨ Getting regular exercise. Ask your health care provider to suggest some activities that are safe for you. °¨ Eating a heart-healthy diet. A registered dietitian can help you to learn healthy eating options. °¨ Maintaining a healthy weight. °¨ Managing diabetes, if necessary. °¨ Reducing stress. °SEEK MEDICAL CARE IF: °· Your chest pain does not go away after treatment. °· You have a rash with blisters on your chest. °· You have a fever. °SEEK IMMEDIATE MEDICAL CARE IF:  °· Your chest pain is worse. °· You have an increasing cough, or you cough up blood. °· You have severe abdominal pain. °· You have severe weakness. °· You faint. °· You have chills. °· You have sudden, unexplained chest discomfort. °· You have sudden, unexplained discomfort in your arms, back, neck, or jaw. °· You have shortness of breath at any time. °· You suddenly start to sweat, or your skin gets clammy. °· You feel nauseous or you vomit. °· You suddenly feel light-headed or dizzy. °· Your heart begins to beat quickly, or it feels like it is skipping beats. °These symptoms may represent a serious problem that is an emergency. Do not wait to see if the symptoms will go away. Get medical help right away. Call your local emergency services (911 in the [place] Do not drive yourself to the hospital. °  °This  information is not intended to replace advice given to you by your health care provider. Make sure you discuss any questions you have with your health care provider. °  °Document Released: 02/10/2005 Document Revised: 05/24/2014 Document Reviewed: 12/07/2013 °Elsevier Interactive Patient Education ©2016 [employer] ° °

## 2017-03-02 IMAGING — CR DG CHEST 2V
1 series · 2 of 2 positions shown · non-contrast
Comparison: None.

CLINICAL DATA: Chest pressure radiating into left upper extremity
for 5 days

EXAM:
CHEST  2 VIEW

[Series 1: dg chest 2 view · 0.14mm/px · 2 of 2 slices shown]
[im 1/2]
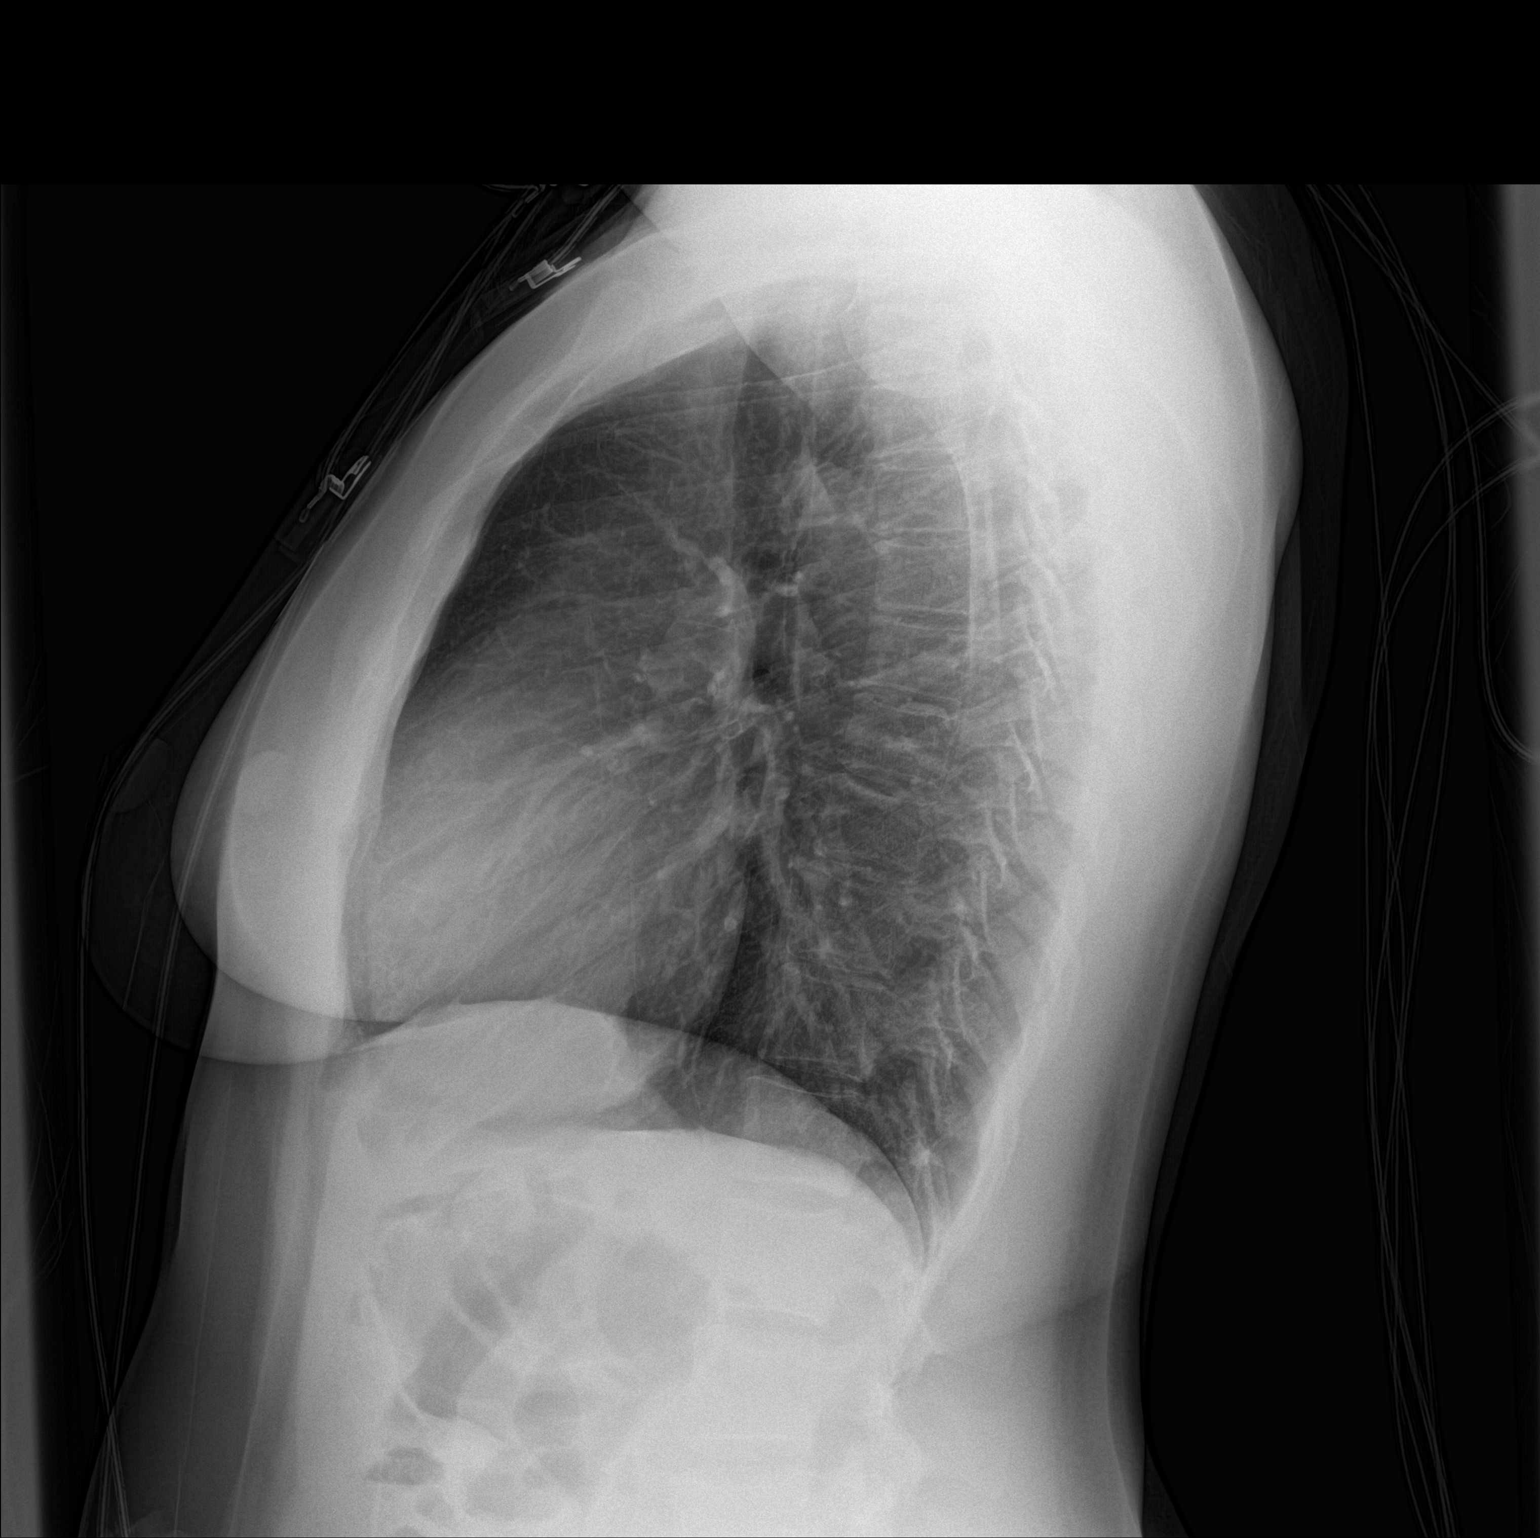
[im 2/2]
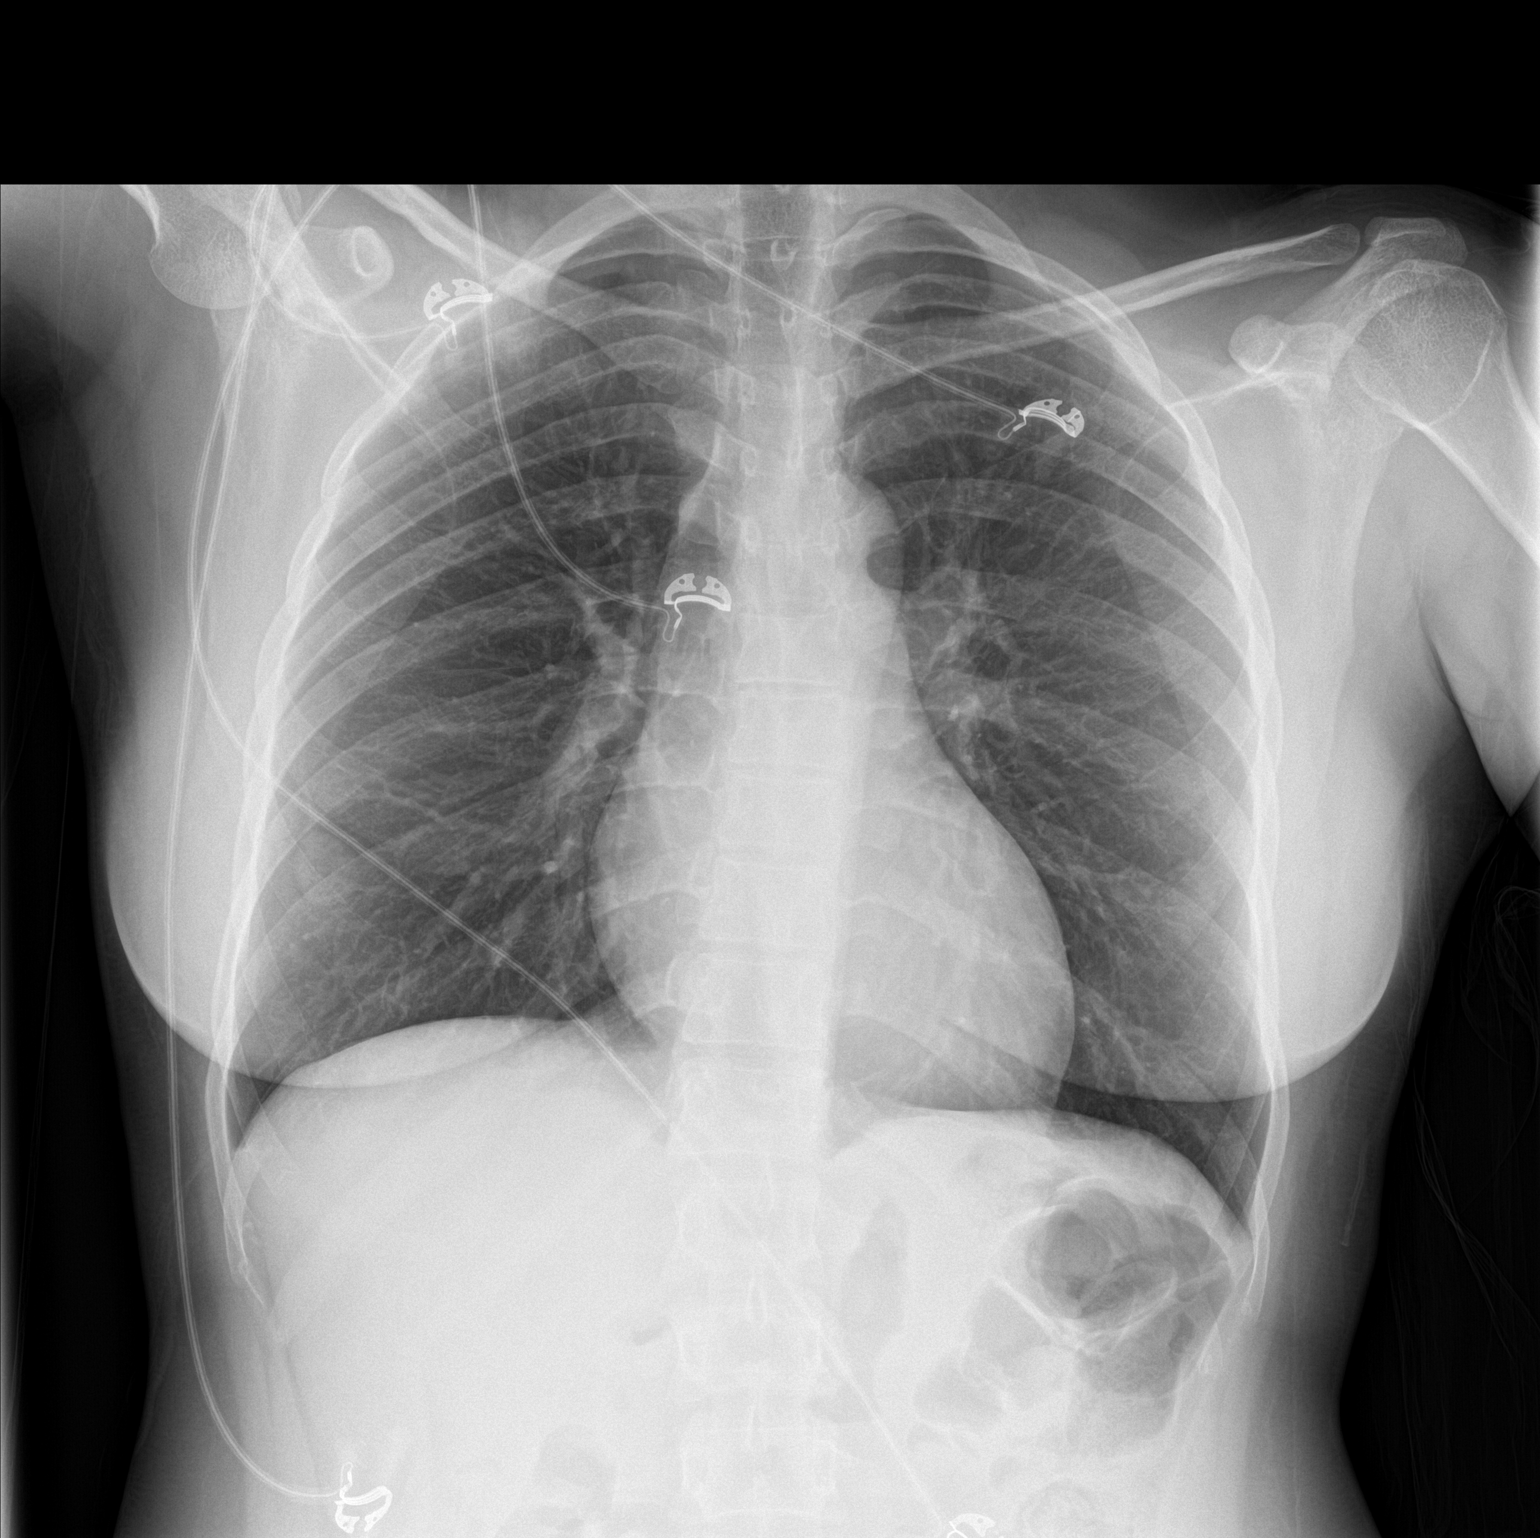

[2 of 2 positions shown; findings below may reference images not displayed]

FINDINGS: Lungs are clear. Heart size and pulmonary vascularity are normal. No
adenopathy. No pneumothorax. There is slight upper thoracic
levoscoliosis. Incidental note is made of a prominent nipple shadow
on the right.
IMPRESSION: No edema or consolidation.
